# Patient Record
Sex: Female | Born: 1985 | Race: White | Hispanic: No | Marital: Married
Health system: Southern US, Community
[De-identification: ages and names within clinical notes are randomized; demographics above are authoritative.]

---

## 2013-06-09 ENCOUNTER — Emergency Department: Payer: Self-pay | Admitting: Emergency Medicine

## 2013-06-09 LAB — CBC WITH DIFFERENTIAL/PLATELET
Basophil #: 0 10*3/uL (ref 0.0–0.1)
Basophil %: 0.5 %
Eosinophil #: 0 10*3/uL (ref 0.0–0.7)
Eosinophil %: 0.6 %
HCT: 39.1 % (ref 35.0–47.0)
Lymphocyte #: 2.6 10*3/uL (ref 1.0–3.6)
Lymphocyte %: 33.7 %
MCHC: 34 g/dL (ref 32.0–36.0)
MCV: 90 fL (ref 80–100)
Monocyte #: 0.5 x10 3/mm (ref 0.2–0.9)
Monocyte %: 6.5 %
Neutrophil %: 58.7 %
RBC: 4.33 10*6/uL (ref 3.80–5.20)
RDW: 13.3 % (ref 11.5–14.5)
WBC: 7.8 10*3/uL (ref 3.6–11.0)

## 2013-06-09 LAB — URINALYSIS, COMPLETE
Bacteria: NONE SEEN
Blood: NEGATIVE
Glucose,UR: NEGATIVE mg/dL (ref 0–75)
Nitrite: NEGATIVE
Ph: 7 (ref 4.5–8.0)
Protein: NEGATIVE
Specific Gravity: 1.019 (ref 1.003–1.030)
Squamous Epithelial: 3
WBC UR: 2 /HPF (ref 0–5)

## 2013-06-09 LAB — COMPREHENSIVE METABOLIC PANEL
Albumin: 4 g/dL (ref 3.4–5.0)
Alkaline Phosphatase: 64 U/L (ref 50–136)
Bilirubin,Total: 0.6 mg/dL (ref 0.2–1.0)
Calcium, Total: 9.1 mg/dL (ref 8.5–10.1)
EGFR (Non-African Amer.): >60
Osmolality: 270 (ref 275–301)

## 2013-06-09 LAB — GC/CHLAMYDIA PROBE AMP

## 2013-06-09 LAB — WET PREP, GENITAL

## 2013-06-12 ENCOUNTER — Ambulatory Visit: Payer: Self-pay | Admitting: Obstetrics and Gynecology

## 2013-06-12 LAB — CBC
MCH: 30.9 pg (ref 26.0–34.0)
MCHC: 34 g/dL (ref 32.0–36.0)
MCV: 91 fL (ref 80–100)
RBC: 4.2 10*6/uL (ref 3.80–5.20)
RDW: 13.1 % (ref 11.5–14.5)

## 2013-06-12 LAB — URINALYSIS, COMPLETE
Bacteria: NONE SEEN
Glucose,UR: NEGATIVE mg/dL (ref 0–75)
Leukocyte Esterase: NEGATIVE
Nitrite: NEGATIVE
Ph: 7 (ref 4.5–8.0)
Protein: NEGATIVE
Squamous Epithelial: 3
WBC UR: 1 /HPF (ref 0–5)

## 2013-06-12 LAB — HCG, QUANTITATIVE, PREGNANCY: Beta Hcg, Quant.: 7621 m[IU]/mL — ABNORMAL HIGH

## 2013-06-12 LAB — HEMOGLOBIN: HGB: 12.6 g/dL (ref 12.0–16.0)

## 2013-06-13 ENCOUNTER — Observation Stay: Payer: Self-pay | Admitting: Obstetrics and Gynecology

## 2013-06-16 LAB — PATHOLOGY REPORT

## 2013-07-24 ENCOUNTER — Encounter: Payer: Self-pay | Admitting: Maternal & Fetal Medicine

## 2013-07-24 LAB — HEMOGLOBIN A1C: Hemoglobin A1C: 5.1 % (ref 4.2–6.3)

## 2013-08-07 ENCOUNTER — Encounter: Payer: Self-pay | Admitting: Obstetrics & Gynecology

## 2013-08-07 LAB — T4, FREE: Free Thyroxine: 1.03 ng/dL (ref 0.76–1.46)

## 2013-09-07 ENCOUNTER — Ambulatory Visit: Payer: Self-pay | Admitting: Obstetrics and Gynecology

## 2013-09-07 LAB — COMPREHENSIVE METABOLIC PANEL
Albumin: 4.5 g/dL (ref 3.4–5.0)
Anion Gap: 6 — ABNORMAL LOW (ref 7–16)
BUN: 7 mg/dL (ref 7–18)
Bilirubin,Total: 0.3 mg/dL (ref 0.2–1.0)
Chloride: 104 mmol/L (ref 98–107)
EGFR (Non-African Amer.): >60
Glucose: 119 mg/dL — ABNORMAL HIGH (ref 65–99)
Osmolality: 271 (ref 275–301)
SGOT(AST): 19 U/L (ref 15–37)
SGPT (ALT): 19 U/L (ref 12–78)
Sodium: 136 mmol/L (ref 136–145)

## 2013-09-07 LAB — URINALYSIS, COMPLETE
Glucose,UR: NEGATIVE mg/dL (ref 0–75)
Leukocyte Esterase: NEGATIVE
Nitrite: NEGATIVE
Protein: 100
RBC,UR: 527 /HPF (ref 0–5)
Specific Gravity: 1.03 (ref 1.003–1.030)
Squamous Epithelial: 2

## 2013-09-07 LAB — CBC
HCT: 41.1 % (ref 35.0–47.0)
MCH: 31.2 pg (ref 26.0–34.0)
MCHC: 34.1 g/dL (ref 32.0–36.0)
MCV: 92 fL (ref 80–100)
Platelet: 218 10*3/uL (ref 150–440)
WBC: 15.3 10*3/uL — ABNORMAL HIGH (ref 3.6–11.0)

## 2013-09-07 LAB — HCG, QUANTITATIVE, PREGNANCY: Beta Hcg, Quant.: 7691 m[IU]/mL — ABNORMAL HIGH

## 2013-09-07 LAB — DRUG SCREEN, URINE
Amphetamines, Ur Screen: NEGATIVE (ref ?–1000)
Barbiturates, Ur Screen: NEGATIVE (ref ?–200)
Benzodiazepine, Ur Scrn: NEGATIVE (ref ?–200)
Cannabinoid 50 Ng, Ur ~~LOC~~: POSITIVE (ref ?–50)
MDMA (Ecstasy)Ur Screen: NEGATIVE (ref ?–500)
Opiate, Ur Screen: NEGATIVE (ref ?–300)
Phencyclidine (PCP) Ur S: NEGATIVE (ref ?–25)
Tricyclic, Ur Screen: NEGATIVE (ref ?–1000)

## 2013-09-07 LAB — GC/CHLAMYDIA PROBE AMP

## 2014-09-13 IMAGING — US US OB < 14 WEEKS - US OB TV
1 series · 14 of 28 positions shown · non-contrast
Comparison: none

REASON FOR EXAM: vag bleeding, pelvic cramping, + urine preg
COMMENTS:

PROCEDURE:     US  - US OB LESS THAN 14 WEEKS/W TRANS  - September 07, 2013  [DATE]
RESULT:     Comparison: None
TECHNIQUE: Multiple transabdominal gray-scale images and endovaginal
gray-scale images of the pelvis performed.

[Series 1: us ob < 14 weeks - us ob tv · 0.23mm/px · 14 of 88 slices shown]
[im 4/88]
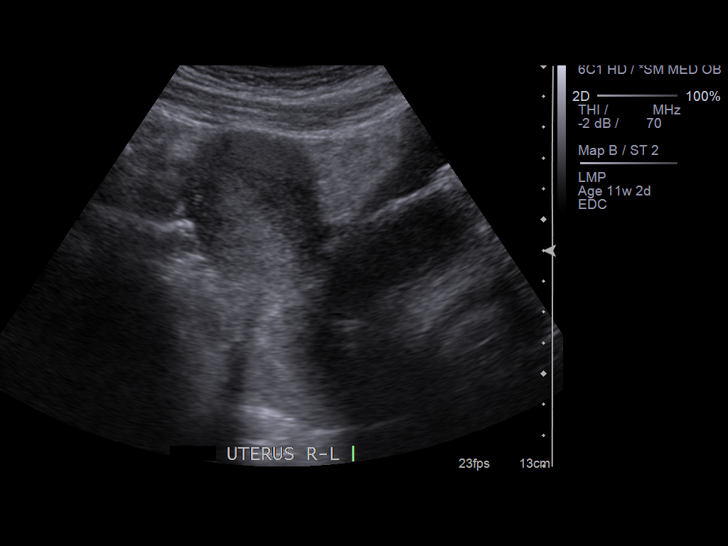
[im 10/88]
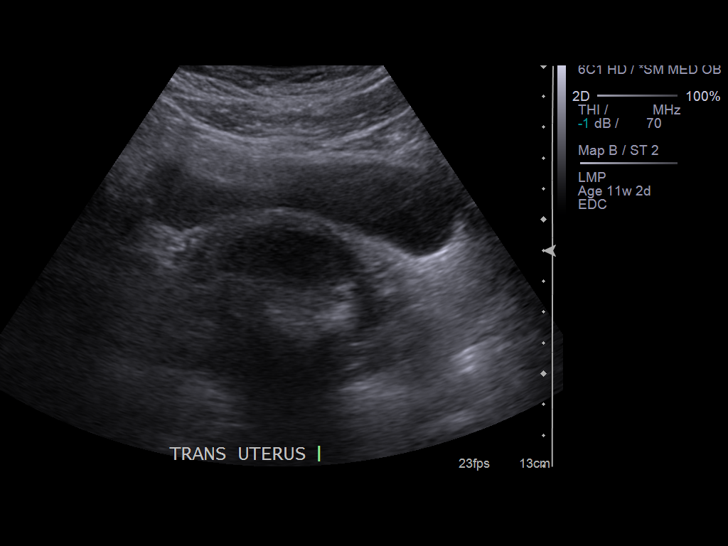
[im 17/88]
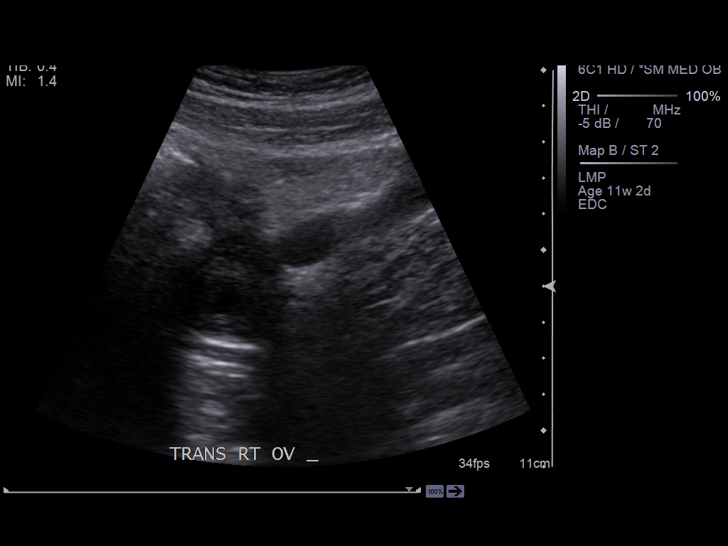
[im 23/88]
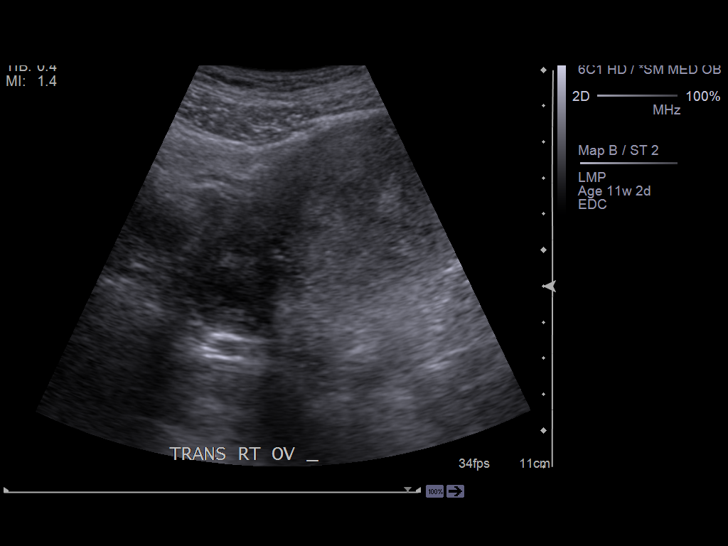
[im 30/88]
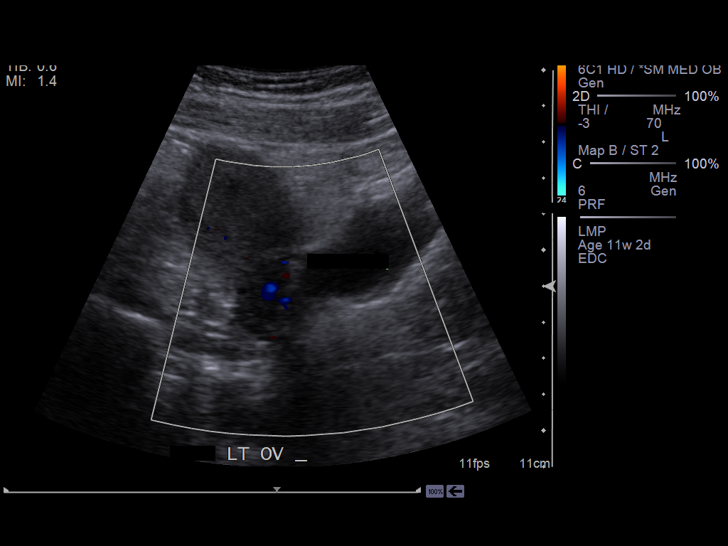
[im 36/88]
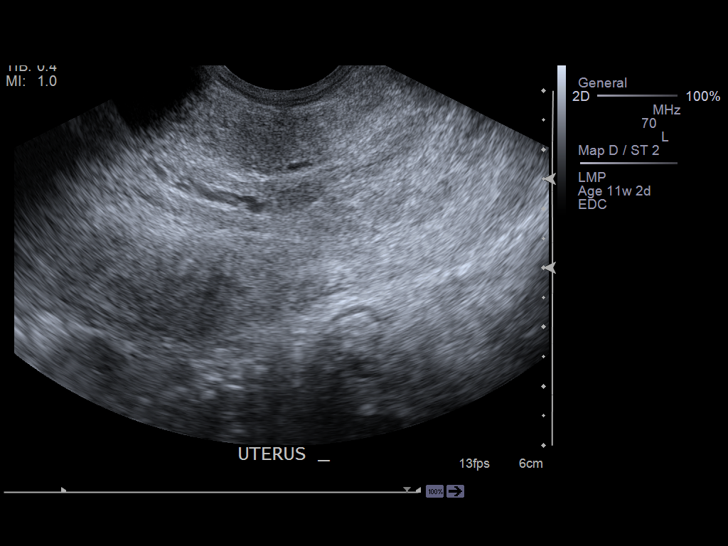
[im 42/88]
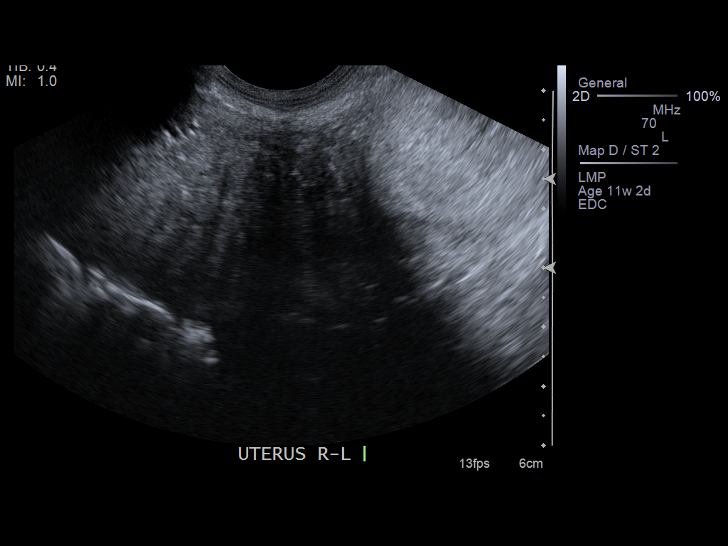
[im 49/88]
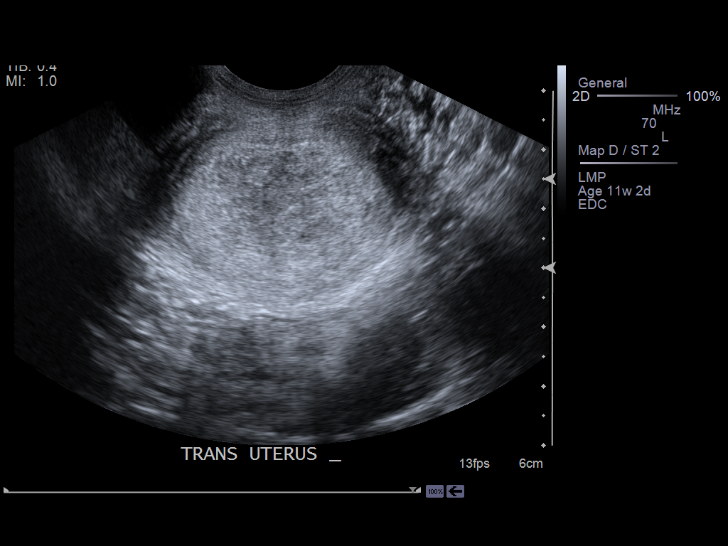
[im 55/88]
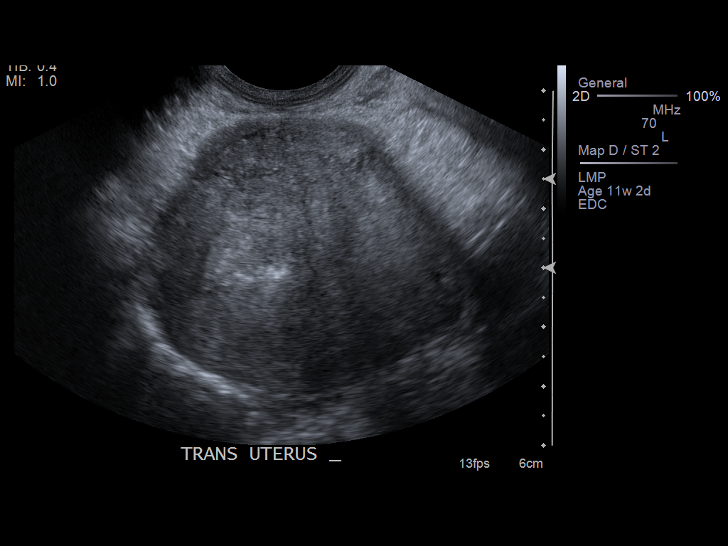
[im 62/88]
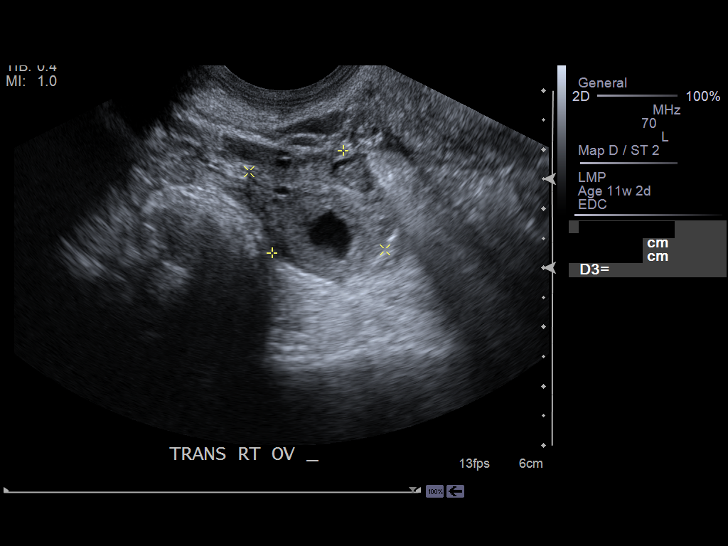
[im 68/88]
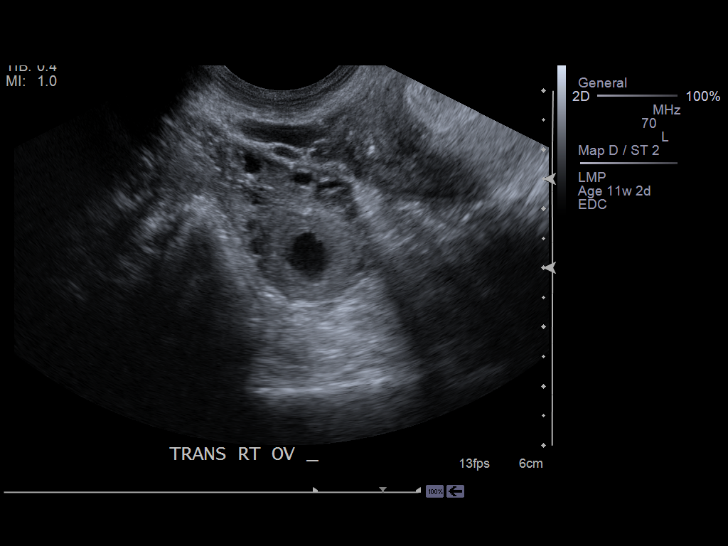
[im 75/88]
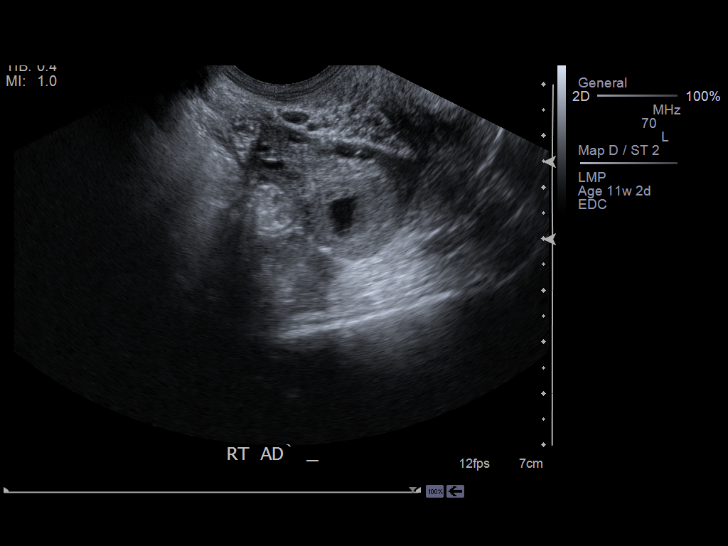
[im 81/88]
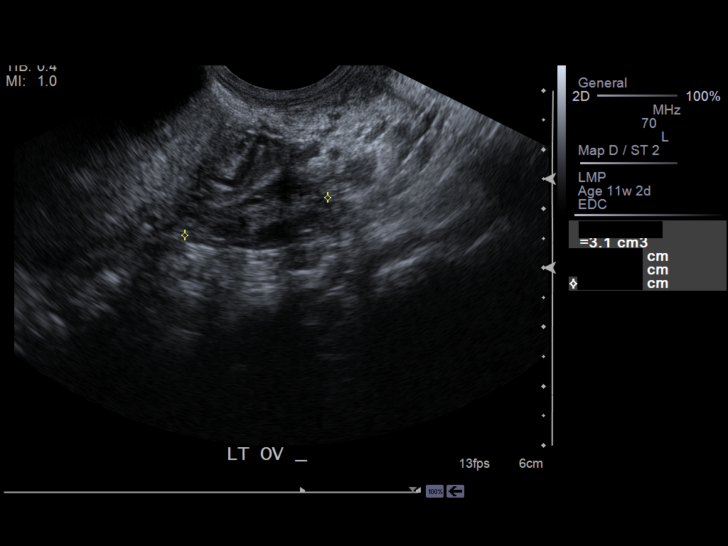
[im 88/88]
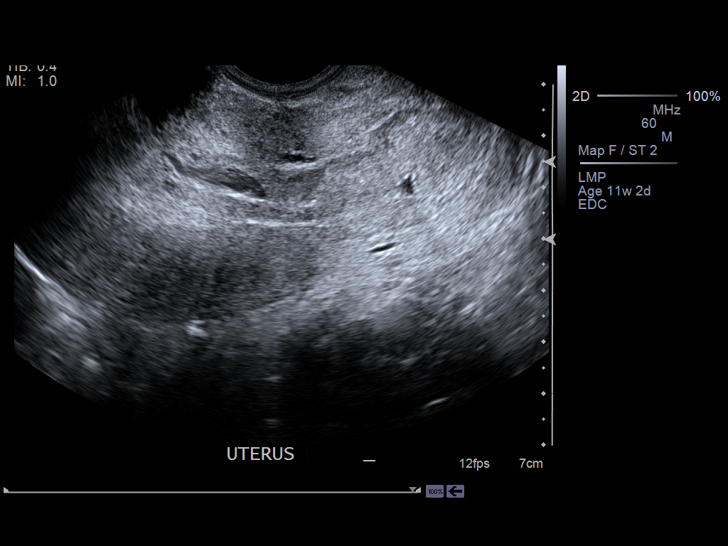

[14 of 28 positions shown; findings below may reference images not displayed]

FINDINGS: No intrauterine pregnancy is identified. The uterus measures 6.1 x 4.6 x
cm. The endometrium is heterogeneously thickened measuring 13.8 mm at the
fundus and 17.7mm in the lower uterine segment.

The right ovary measures 2.1 x 2.7 x 3.1 cm.  The left ovary measures 2 x
1.2 x 2.5 cm.  There is a 1.6 cm anechoic right ovarian mass likely
resenting a cyst.

There is no pelvic free fluid.
IMPRESSION: No intrauterine pregnancy is identified. Given the elevated beta-hCG,
differential diagnosis includes an early pregnancy versus missed abortion
versus ectopic pregnancy. Recommend clinical correlation, serial
quantitative beta HCGs, ectopic precautions, and followup ultrasound as
clinically indicated.

[REDACTED]

## 2015-03-19 NOTE — Op Note (Signed)
PATIENT NAME:  Sandra Simpson, [name] MR#:  [id number] DATE OF BIRTH:  11-11-1986  DATE OF PROCEDURE:  09/07/2013  PREOPERATIVE DIAGNOSIS:  Incomplete abortion.   POSTOPERATIVE DIAGNOSIS: Incomplete abortion.   PROCEDURE:  Suction dilation and curettage.   ANESTHESIA: MAC, general.   SURGEON: Suzy Bouchardhomas J Tujuana Kilmartin, MD   INDICATIONS: A 29 year old, gravida 5015, para zero patient at approximately 10 weeks estimated gestational age, presented to the Emergency Room with bleeding and cramping. The patient did demonstrate a hypotensive episode in the hospital. A pelvic exam demonstrates cervix to be slightly dilated and uterus 10 weeks in size, consistent with incomplete abortion. Ultrasound done on the day of the procedure showed no fetal pole intrauterine.   DESCRIPTION OF PROCEDURE: After adequate MAC general anesthesia, the patient was placed in the dorsal supine position with legs in the candy-cane stirrups. Lower perineal and vagina were prepped with Betadine. The patient was sterilely draped. A weighted speculum was placed in the posterior vaginal vault, and the anterior cervix was grasped with a single-tooth tenaculum. A clot sitting at the cervical os was noted. Cervix was dilated with a #20 Hanks dilator without difficulty, and a #8 flexible suction curette was placed into the endometrial cavity. A small amount of clot and tissue consistent with products of conception was removed. Sharp curettage performed, with no additional tissue noted, and a repeat suction curette again with minimal tissue. Good hemostasis noted at the end of the procedure. Estimated blood loss was minimal.     ____________________________ Suzy Bouchardhomas J. Eduardo Wurth, MD tjs:mr D: 09/07/2013 18:43:11 ET T: 09/07/2013 19:30:35 ET JOB#: [id number]  cc: Suzy Bouchardhomas J. Rhaelyn Giron, MD, <Dictator> Suzy BouchardHOMAS J Elmire Amrein MD ELECTRONICALLY SIGNED 09/15/2013 22:09

## 2015-03-19 NOTE — Op Note (Signed)
PATIENT NAME:  Sandra Simpson, Neeya K MR#:  [id number] DATE OF BIRTH:  09-04-1986  DATE OF PROCEDURE:  06/13/2013  PREOPERATIVE DIAGNOSIS: Incomplete abortion at 9 weeks.   POSTOPERATIVE DIAGNOSIS: Incomplete abortion at 9 weeks.   PROCEDURE:  Suction dilation and curettage.  SURGEON: Ricky L. Logan BoresEvans, MD  ANESTHESIA:  LMA by Dr. Darleene CleaverVan Staveren.  FINDINGS:  Blood in the vault which began early this morning. Cervix visually closed but easily dilatable. Returned tissue consistent with products of conception. Excellent hemostasis, cosmesis.   ESTIMATED BLOOD LOSS: 50.   COMPLICATIONS: None.   DRAINS: None.   PROCEDURE IN DETAIL: The patient was seen in the office 2 days ago and planned for D and C.   Presented to the ER earlier this morning around 4:00 a.m., and given the surgery was scheduled for 7:30, she was held on mother-baby floor, brought to the OR.  I reviewed the procedure with the patient again, stated understanding, and taken the operating room and placed in the supine position.  Antibiotics were given. LMA was then begun. She was placed in the dorsal lithotomy position using Allen stirrups. The cervix was visualized and grasped with a single-tooth tenaculum and easily dilated up to admit a #10 rigid curved suction curette, which was placed without difficulty with immediate return of products of conception. Two more passes led to tightening of cervix and minimal return of bubbly blood.   Sharp curette was then used to gently explore the cavity. There was no evidence of retained tissue and cavity appeared to be normal shape.   One additional pass with the suction curette showed no return of blood. At this point, the procedure was felt to achieve maximum efficacy. Instruments were removed.  The cervix was seen to be hemostatic. The patient was returned to the supine position and left in the care of anesthesia.   The patient tolerated the procedure well. She will be given 5 days of doxycycline  and a prescription for Norco to take as needed, and I will see her back in 2 weeks.   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  ____________________________ Reatha Harpsicky L. Logan BoresEvans, MD rle:sb D: 06/13/2013 08:04:39 ET T: 06/13/2013 08:14:56 ET JOB#: [id number]  cc: Clide Clifficky L. Logan BoresEvans, MD, <Dictator> Augustina MoodICK L Gregorey Nabor MD ELECTRONICALLY SIGNED 06/15/2013 10:07

## 2015-03-19 NOTE — Consult Note (Signed)
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  Thirteen of her losses were conceived with her former husband (he has two healthy children) and she reports normal karyotypes performed at [facility].  She reports and IUFD at [redacted] weeks gestation ('the placenta separated from the uterus').   She reports one anembryonic pregnancy.  The remainder of her losses ended in the first trimester ('the hearts stopped'). Her description is consistent with missed abortions.  She reports a history of several D&C procedures.   Referring Physician Dr. Patty Sermons   Past Obstetrical Hx As above--13 first trimester,  missed abortions.  Most recent loss this year occured with different partner (missed ab measuring 8 weeks-- d&c performed) 2006--~24 weeks, IUFD in setting of placenta previa and ?abrution   Allergies:   Penicillin: Anaphylaxis  Darvocet: Anaphylaxis  Vital Signs/Notes:  Nursing Vital Signs: **Vital Signs.:   28-Aug-14 09:22  Vital Signs Type Routine  Temperature Temperature (F) 98.8  Celsius 37.1  Temperature Source oral  Pulse Pulse 94  Respirations Respirations 16  Systolic BP Systolic BP 97  Diastolic BP (mmHg) Diastolic BP (mmHg) 66  Mean BP 76  BP Source  if not from Vital Sign Device non-invasive   Perinatal Consult:  Past Medical History cont'd She also reports history of anxiety disorder/'panic attacks'--she reports history of chest pain resulting in cardiac evaluation. Initial episode occured approximately 7 years ago. She reports negative ECG and Echocardiogram at that time. Her last episode was over a year ago.   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   Soc Hx she recently relocated to [place] from [place] due to partner     Thyroid:  28-Aug-14 10:42   Thyroid  Stimulating Hormone  0.395 (0.45-4.50 (International Unit)  ----------------------- Pregnant patients have  different reference  ranges for TSH:  - - - - - - - - - -  Pregnant, first trimetser:  0.36 - 2.50 uIU/mL)  Routine Chem:  28-Aug-14 10:42   Hemoglobin A1c ([facility]) 5.1 (The American Diabetes Association recommends that a primary goal of therapy should be <7% and that physicians should reevaluate the treatment regimen in patients with HbA1c values consistently >8%.)   Impression/Recommendations:  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 We addressed the potential etiologies of first trimester losses.  Aneuploidy is the most common reason for these losses;her history is concerning for possible balanced translocation.  She reports normal parental karyotypes and  we have requested these records from [facility].   She also met with our genetic counselor today.  Please see her consult for review.  We also addressed conditions such as the antiphospholipid antibody syndrome, endocrine disorders, and uterine anomalies as additional etiologies for these losses.   Recommendations 1. She met with our genetic counselor today.   We will obtain results of her karyotype analysis from [facility] 2. We ordered TSH, Hgb A1c, and  Anticardiolipin Antibody screening.   3. Her TSH is slightly decreased. I would recommed  obtaining T3/T4.  She does not have clinical stigmata consistent with hyperthyroidism and this condition is not likely the underlying etiology for her losses, but should be evaluated.  4. I would also recommend she have HSG or sonohysterogram. 5. If the above testing returns normal, I would offer her a  consultation with Aaron Edelman.  He's a Reproductive Endocrinologist at [facility] with a special interest in recurrent pregnancy loss.  He may provide additional therapeutic options for this patient.  6.  I have scheduled her for a follow up visit in 2 weeks to review labs and future  options. She's currently in transition and does not have a reliable phone. 7. She should initiate folic acid/prenatal vitamins.   Plan:  Genetic Counseling yes, performed today   Additional Testing Folate/prenatal vitamins    Total Time Spent with Patient 30 minutes   >50% of visit spent in couseling/coordination of care yes   Office Use Only 99242  Level 2 (78mn) NEW office consult exp prob focused   Coding Description: OTHER: recurrent pregnancy loss.  Electronic Signatures: SManfred Shirts(MD)  (Signed 28-Aug-14 15:46)  Authored: Referral, Home Medications, Allergies, Vital Signs/Notes, Consult, Lab, Impression, Plan, Billing, Coding Description   Last Updated: 28-Aug-14 15:46 by SManfred Shirts(MD)

## 2015-03-19 NOTE — Consult Note (Signed)
Referral Information:  Reason for Referral Review lab results in setting of recurrent first trimester losses   [facility]   Prenatal Hx 29 year-old G14 P0-1-13-0 seen for pre-conception MFM consultation on 07/24/13.  See that report.  Pt had blood work sent for antiphospholipid antiboida, hemoglobin A1C and TSh. She also reports having a personal karyotype perfomred [facility].  She returns for results of her tests. Her last D&C was about 2 monts ago and has not yet had a period.   Allergies:   Penicillin: Anaphylaxis  Darvocet: Anaphylaxis  Vital Signs/Notes:  Nursing Vital Signs:  **Vital Signs.:   11-Sep-14 10:59  Pulse Pulse 79  Systolic BP Systolic BP 105  Diastolic BP (mmHg) Diastolic BP (mmHg) 62     Thyroid:  28-Aug-14 10:42   Thyroid Stimulating Hormone  0.395 (0.45-4.50 (International Unit)  ----------------------- Pregnant patients have  different reference  ranges for TSH:  - - - - - - - - - -  Pregnant, first trimetser:  0.36 - 2.50 uIU/mL)  General Ref:  28-Aug-14 10:42   Anticardiolipin ABS IgG IgM ========== TEST NAME ==========  ========= RESULTS =========  = REFERENCE RANGE =  ANTICARDIOLPN AB IGG,IGM  Anticardiolipin Ab, IgG/M, Qn Anticardiolipin Ab,IgG,Qn       [   <9 GPL U/mL          ]              0-14             Negative:              <15                                          Indeterminate:     15 - 20                                          Low-Med Positive: >20 - 80                                          High Positive:         >80 Anticardiolipin Ab,IgM,Qn       [   <9 MPL U/mL          ]              0-12                                          Negative:              <13                                          Indeterminate:     13 - 20   Low-Med Positive: >20 - 80  High Positive:         >80               [facility]            No:  [id number]           [address] [place], [place] [address]           Mila Homer, MD     [phone]   Result(s) reported on 26 Jul 2013 at 05:48PM.  Lupus Anticoagulant Comprehensive ========== TEST NAME ==========  ========= RESULTS =========  = REFERENCE RANGE =  LUPUS COMPREHNSV PROFILE  Lupus Anticoagulant Comp Dilute Prothrombin Time(dPT)    [   40.9 sec             ]          0.0-55.0 dPT Confirm Ratio               [  1.01 Ratio           ]         0.00-1.20 Thrombin Time                   [   15.2 sec             ]          0.0-20.0 PTT-LA                          [   38.7 sec             ]          0.0-50.0 dRVVT                           [   33.0 sec      ]          0.0-55.1 Lupus Reflex Interpretation     [   Comment:             ]                   No lupus anticoagulant was detected.               [facility]            No: [id number]           [address], [place], [place] [address]           Mila Homer, MD         [phone]   Result(s) reported on 29 Jul 2013 at 12:51PM.  Beta 2 Glycoprotein ========== TEST NAME ==========  ========= RESULTS =========  = REFERENCE RANGE =  BETA2 GLYCOPROTEIN  Beta-2 Glycoprotein I Ab,G,A,M Beta-2 Glycoprotein I Ab, IgG   [   <9 GPI IgG units     ]              0-20 The reference interval reflects a3SD or 99th percentile interval, which is thought to represent a potentially clinically significant result in accordance with the International Consensus Statement on the classification criteria for definitive antiphospholipid syndrome (APS). J Thromb Haem 2006;4:295-306. Beta-2 Glycoprotein I Ab, IgA   [   <9 GPI IgA units     ]              0-25 The reference interval reflects a 3SD or 99th percentile  interval, which is thought to represent a potentially clinically significant result inaccordance with the International Consensus Statement on the classification criteria for definitive  antiphospholipid syndrome (APS). J Thromb Haem 2006;4:295-306. Beta-2 Glycoprotein I Ab, IgM   [   <9 GPI IgM units     ]              0-32 The reference interval reflects a 3SD or 99th percentile interval, which is thought to represent a potentially clinically significant result in accordance with the International Consensus Statement on the classification criteria for definitive antiphospholipid syndrome (APS). J Thromb Haem 2006;4:295-306.               [facility]            No: [id number]           [address], [place], [place] [address]           Mila Homer, MD         [phone]   Result(s) reported on 26 Jul 2013 at 05:48PM.  Routine Chem:  28-Aug-14 10:42   Hemoglobin A1c [facility]) 5.1 (The American Diabetes Association recommends that a primary goal of therapy should be <7% and that physicians should reevaluate the treatment regimen in patients with HbA1c values consistently >8%.)   Impression/Recommendations:  Impression 29 year-old G14 P0-1-13-0 with history of recurrent first trimester pregnancy loss, here to review her labs.   Recommendations -See full consult from Dr. Dolphus Jenny, dated 07/24/13 -Her antiphospholipid antibody testing were all normal (neg lupus anticoagulant, neg anticardiolipin antibodies and neg anti-beta 2 glycoprotein antibodies) -We called [facility] again and they have told us that they are processing the request for the patient's karyotype and will have it to [place] witin 30 days -TSH mildly supressed. She does not have symtpoms of hyperthyroidism. Today we sent Free T3 and Free T4 -Discussed need to asses her uterine cavity. We have arranged for an SIS in the [facility] aid/development worker. Pt will call them with her next period. If she does not get a period within the next 4 weeks, she will call [facility] for workup and will check a home pregnancy test. -We arranged a followup consult  to be done in 8 weeks to review results from her SIS and karyotype, If her T3 and T4 testing is abnormal, she will need referral to endocrine.    Total Time Spent with Patient 15 minutes   Office Use Only 99213  Office Visit Level 3 ( ) EST exp prob focused outpt   Coding Description: MATERNAL CONDITIONS/HISTORY INDICATION(S).   OTHER: Recurrent pregnancy loss.  Electronic Signatures: Preston Garabedian, [place] (MD)  (Signed 11-Sep-14 16:12)  Authored: Referral, Home Medications, Allergies, Vital Signs/Notes, Lab, Impression, Billing, Coding Description   Last Updated: 11-Sep-14 16:12 by Polina Burmaster, [place] (MD)

## 2015-03-19 NOTE — Consult Note (Signed)
PATIENT NAME:  Sandra Simpson, Donetta K MR#:  [id number] DATE OF BIRTH:  05-Mar-1986  DATE OF CONSULTATION:  09/07/2013  CONSULTING PHYSICIAN:  Suzy Bouchardhomas J. Lorilynn Lehr, MD  REQUESTING PHYSICIAN: PA Kathrynn RunningManning, Emergency Department, [facility]  HISTORY OF PRESENT ILLNESS:  A 29 year old gravida 2515, para zero patient with recurrent pregnancy loss, underwent a D and C for an abortion by Dr. Logan BoresEvans mid July. The patient did not have a menstrual cycle and did not use contraception, and came into the Emergency Department with heavy bleeding and cramping. The patient had been spotting for the last week, had passed tissue today. Quantitative hCG of 7691. Blood type O-Positive.   PAST MEDICAL HISTORY:   1.  Low back pain.  2.  Recurrent pregnancy loss.   PAST SURGICAL HISTORY:  Dilation and curettage x 6.   REVIEW OF SYSTEMS: Unremarkable, except for genitourinary history of recurrent pregnancy loss.   FAMILY HISTORY: Unremarkable.   MEDICATIONS:  None.  ALLERGIES:  PENICILLIN AND DARVOCET.   SOCIAL HISTORY:  Patient uses a vaporizer inhaler. No alcohol. She was to cannabis positive in the ED today.   PHYSICAL EXAMINATION: GENERAL:  A well-developed, well-nourished white female.  VITAL SIGNS:  Initial blood pressure 130/64, serial blood pressures have lowered, most recent 86/54.  LUNGS:  Clear to auscultation.  CARDIOVASCULAR: Regular rate and rhythm.  ABDOMEN:  Soft, nontender.  PELVIC: Bedside examination digital only revealed cervix to be partially dilated, uterus approximately 10 to 12 weeks in size, and non-clotted blood on examining glove.   ASSESSMENT:  Incomplete abortion.   PLAN:  Recommend suction dilation and curettage, and a thorough evaluation for recurrent pregnancy loss before she tries to conceive again.     ____________________________ Suzy Bouchardhomas J. Liberty Seto, MD tjs:mr D: 09/07/2013 17:13:48 ET T: 09/07/2013 17:34:43 ET JOB#: [id number]  cc: Suzy Bouchardhomas J.  Nazly Digilio, MD, <Dictator> Suzy BouchardHOMAS J Jameka Ivie MD ELECTRONICALLY SIGNED 09/15/2013 22:09

## 2015-04-06 NOTE — H&P (Signed)
L&D Evaluation:  History:  HPI 27 G15 P0 at 10 weeks with bleeding and cramping . Hypotension in ED, Passed tissue . U/s no fetal pole. BT O+   Patient's Medical History recurrent preg loss   Patient's Surgical History D&C   Allergies PCN, darvocet   Social History none   Family History Non-Contributory   ROS:  ROS All systems were reviewed.  HEENT, CNS, GI, GU, Respiratory, CV, Renal and Musculoskeletal systems were found to be normal.   Exam:  Vital Signs stable   General no apparent distress   Mental Status clear   Chest clear   Heart normal sinus rhythm   Abdomen gravid, non-tender   Reflexes 1+   Pelvic os open + b lood , UTX 10 wks   Impression:  Impression incomplete AB   Plan:  Plan Suction D+ C   Electronic Signatures: Schermerhorn, Ihor Austinhomas J (MD)  (Signed 12-Oct-14 18:40)  Authored: L&D Evaluation   Last Updated: 12-Oct-14 18:40 by Suzy BouchardSchermerhorn, Thomas J (MD)
